# Patient Record
Sex: Female | Born: 1995 | Race: Black or African American | Hispanic: No | Marital: Single | Smoking: Current every day smoker
Health system: Southern US, Community
[De-identification: ages and names within clinical notes are randomized; demographics above are authoritative.]

---
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
## 2018-01-11 ENCOUNTER — Other Ambulatory Visit: Payer: Self-pay

## 2018-01-11 ENCOUNTER — Emergency Department (HOSPITAL_COMMUNITY)
Admission: EM | Admit: 2018-01-11 | Discharge: 2018-01-11 | Disposition: A | Discharge status: Home / Self Care | Payer: Self-pay | Attending: Emergency Medicine | Admitting: Emergency Medicine

## 2018-01-11 ENCOUNTER — Encounter (HOSPITAL_COMMUNITY): Payer: Self-pay | Admitting: Emergency Medicine

## 2018-01-11 DIAGNOSIS — J3489 Other specified disorders of nose and nasal sinuses: Secondary | ICD-10-CM | POA: Insufficient documentation

## 2018-01-11 DIAGNOSIS — F17210 Nicotine dependence, cigarettes, uncomplicated: Secondary | ICD-10-CM | POA: Insufficient documentation

## 2018-01-11 NOTE — ED Notes (Signed)
Pt stable, ambulatory, and verbalizes understanding of d/c instructions.  

## 2018-01-11 NOTE — ED Triage Notes (Signed)
Pt here with c/o runny nose, watery eyes, and pressure on left side of head.  Pt states a Temp of 99.9 at home.

## 2018-01-11 NOTE — Discharge Instructions (Signed)
Please purchase an expectorant like Mucinex to help loosen and thin the mucus production.You may also try some over the counter Robitussin to help with your cough. Continue to hydrate with plenty of fluids and Gatorade.If you experience any shortness of breath, chest pain or fever please return to the ED for reevaluation.  ° °

## 2018-01-11 NOTE — ED Provider Notes (Signed)
[facility]   CSN: [id number] Arrival date & time: 01/11/18  1221     History   Chief Complaint Chief Complaint  Patient presents with  . Flu Like Symptoms    HPI Aubriella Britt [place] is a 22 y.o. female.  22 y.o female with no PMH presents to the ED with a chief complaint of sore throat, cough, congestion and flulike symptoms.  She reports the symptoms began last night and she took some "some cold medication".  She reports pain along the left side of her temples that is worse with blowing her nose.  She states the sore throat is worse with swallowing.  Denies any alleviating symptoms at this time.  She denies any fever, shortness of breath, chest pain, or difficulty swallowing.     History reviewed. No pertinent past medical history.  There are no active problems to display for this patient.   History reviewed. No pertinent surgical history.   OB History   No obstetric history on file.      Home Medications    Prior to Admission medications   Not on File    Family History History reviewed. No pertinent family history.  Social History Social History   Tobacco Use  . Smoking status: Current Every Day Smoker    Packs/day: 0.50    Years: 2.00    Pack years: 1.00    Types: Cigarettes  Substance Use Topics  . Alcohol use: Never    Frequency: Never  . Drug use: Never     Allergies   Patient has no allergy information on record.   Review of Systems Review of Systems  Constitutional: Negative for fever.  HENT: Positive for rhinorrhea.   Respiratory: Negative for shortness of breath.   Cardiovascular: Negative for chest pain.     Physical Exam Updated Vital Signs BP 126/78 (BP Location: Right Arm)   Pulse 81   Temp 98.5 F (36.9 C) (Oral)   Resp 16   Ht 5\' 4"  (1.626 m)   Wt 61.2 kg   LMP 12/17/2017   SpO2 100%   BMI 23.17 kg/m   Physical Exam Vitals signs and nursing note reviewed.    Constitutional:      General: She is not in acute distress.    Appearance: She is well-developed.  HENT:     Head: Normocephalic and atraumatic.     Mouth/Throat:     Pharynx: No oropharyngeal exudate or posterior oropharyngeal erythema.     Comments: No erythema, edema, oropharynx is clear no tonsillar exudate or tonsillar abscess noted. Eyes:     Conjunctiva/sclera:     Right eye: Right conjunctiva is injected.     Left eye: Left conjunctiva is injected.     Pupils: Pupils are equal, round, and reactive to light.  Neck:     Musculoskeletal: Normal range of motion.  Cardiovascular:     Rate and Rhythm: Regular rhythm.     Heart sounds: Normal heart sounds.  Pulmonary:     Effort: Pulmonary effort is normal. No respiratory distress.     Breath sounds: Normal breath sounds.     Comments: Lungs are clear to auscultation throughout all fields. Abdominal:     General: Bowel sounds are normal. There is no distension.     Palpations: Abdomen is soft.     Tenderness: There is no abdominal tenderness.  Musculoskeletal:        General: No tenderness or deformity.  Right lower leg: No edema.     Left lower leg: No edema.  Skin:    General: Skin is warm and dry.  Neurological:     Mental Status: She is alert and oriented to person, place, and time.      ED Treatments / Results  Labs (all labs ordered are listed, but only abnormal results are displayed) Labs Reviewed - No data to display  EKG None  Radiology No results found.  Procedures Procedures (including critical care time)  Medications Ordered in ED Medications - No data to display   Initial Impression / Assessment and Plan / ED Course  I have reviewed the triage vital signs and the nursing notes.  Pertinent labs & imaging results that were available during my care of the patient were reviewed by me and considered in my medical decision making (see chart for details).    Patient presents with  URI symptoms  that began last night. No fever, erythema in her oropharynx, no chest pain or shortness of breath. Patient is very well appearing, no distress, vital signs are unremarkable. Low suspicion for flu as patient is afebrile, no tachycardia  No cough and well appearing. Lung sounds are unremarkable. After evaluation patient she stated " so can I get a work note for this visit". I will provide note for patient. She is advised to follow up with primary care as needed.    Final Clinical Impressions(s) / ED Diagnoses   Final diagnoses:  Rhinorrhea    ED Discharge Orders    None       Claude MangesSoto, Ryllie Nieland, PA-C 01/11/18 1328    Melene PlanFloyd, Dan, DO 01/11/18 1334
